# Patient Record
Sex: Male | Born: 2000 | Hispanic: Yes | Marital: Single | Smoking: Never smoker
Health system: Southern US, Community
[De-identification: ages and names within clinical notes are randomized; demographics above are authoritative.]

---
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
## 2018-09-26 ENCOUNTER — Emergency Department
Admission: EM | Admit: 2018-09-26 | Discharge: 2018-09-26 | Disposition: A | Discharge status: Home / Self Care | Payer: Self-pay | Attending: Emergency Medicine | Admitting: Emergency Medicine

## 2018-09-26 ENCOUNTER — Other Ambulatory Visit: Payer: Self-pay

## 2018-09-26 DIAGNOSIS — L03317 Cellulitis of buttock: Secondary | ICD-10-CM | POA: Insufficient documentation

## 2018-09-26 DIAGNOSIS — L0231 Cutaneous abscess of buttock: Secondary | ICD-10-CM | POA: Insufficient documentation

## 2018-09-26 MED ORDER — SULFAMETHOXAZOLE-TRIMETHOPRIM 800-160 MG PO TABS
1.0000 | ORAL_TABLET | Freq: Once | ORAL | Status: AC
  Administered 2018-09-26: 1 via ORAL
  Filled 2018-09-26: qty 1

## 2018-09-26 MED ORDER — NAPROXEN 500 MG PO TABS: 500.0000 mg | ORAL_TABLET | Freq: Two times a day (BID) | ORAL | Status: AC

## 2018-09-26 MED ORDER — SULFAMETHOXAZOLE-TRIMETHOPRIM 800-160 MG PO TABS: 1.0000 | ORAL_TABLET | Freq: Two times a day (BID) | ORAL | 0 refills | Status: AC

## 2018-09-26 MED ORDER — TRAMADOL HCL 50 MG PO TABS
50.0000 mg | ORAL_TABLET | Freq: Four times a day (QID) | ORAL | 0 refills | Status: AC | PRN
Start: 2018-09-26 — End: 2018-10-01

## 2018-09-26 MED ORDER — NAPROXEN 500 MG PO TABS
500.0000 mg | ORAL_TABLET | Freq: Once | ORAL | Status: AC
  Administered 2018-09-26: 500 mg via ORAL
  Filled 2018-09-26: qty 1

## 2018-09-26 NOTE — Discharge Instructions (Addendum)
Your lesion does not require incision and drainage at this time.  Follow discharge care instructions and take medication as directed.  Return to ED if condition worsens.

## 2018-09-26 NOTE — ED Triage Notes (Signed)
Pt states abscess to mid buttocks since Tuesday. Pt points to pilonidal area. Pt denies known fever, states hurts to sit.

## 2018-09-26 NOTE — ED Provider Notes (Signed)
[facility] Emergency Department Provider Note   ____________________________________________   First MD Initiated Contact with Patient 09/26/18 2125     (approximate)  I have reviewed the triage vital signs and the nursing notes.   HISTORY  Chief Complaint Abscess    HPI Kent Bowman is a 18 y.o. male patient presents with abscess between the buttocks for 3 days.  Patient denies drainage.  Patient states becoming uncomfortable to sit.  Patient rates his pain is 8/10.  Patient described the pain is "achy".  No palliative measure for complaint.         No past medical history on file.  There are no active problems to display for this patient.   No past surgical history on file.  Prior to Admission medications   Medication Sig Start Date End Date Taking? Authorizing Provider  ciprofloxacin (CIPRO) 500 MG tablet Take 1 tablet (500 mg total) by mouth 2 (two) times daily. 11/14/16   Paulette Blanch, MD  naproxen (NAPROSYN) 500 MG tablet Take 1 tablet (500 mg total) by mouth 2 (two) times daily with a meal. 09/26/18   Sable Feil, PA-C  ondansetron (ZOFRAN ODT) 4 MG disintegrating tablet Take 1 tablet (4 mg total) by mouth every 8 (eight) hours as needed for nausea or vomiting. 11/14/16   Paulette Blanch, MD  sulfamethoxazole-trimethoprim (BACTRIM DS) 800-160 MG tablet Take 1 tablet by mouth 2 (two) times daily. 09/26/18   Sable Feil, PA-C  traMADol (ULTRAM) 50 MG tablet Take 1 tablet (50 mg total) by mouth every 6 (six) hours as needed for up to 5 days. 09/26/18 10/01/18  Sable Feil, PA-C    Allergies Patient has no known allergies.  No family history on file.  Social History Social History   Tobacco Use  . Smoking status: Never Smoker  . Smokeless tobacco: Never Used  Substance Use Topics  . Alcohol use: No  . Drug use: Not on file    Review of Systems Constitutional: No fever/chills Eyes: No visual changes. ENT: No sore  throat. Cardiovascular: Denies chest pain. Respiratory: Denies shortness of breath. Gastrointestinal: No abdominal pain.  No nausea, no vomiting.  No diarrhea.  No constipation. Genitourinary: Negative for dysuria. Musculoskeletal: Negative for back pain. Skin: Negative for rash.  Nodule lesion between buttocks. Neurological: Negative for headaches, focal weakness or numbness.   ____________________________________________   PHYSICAL EXAM:  VITAL SIGNS: ED Triage Vitals  Enc Vitals Group     BP 09/26/18 2105 121/66     Pulse Rate 09/26/18 2105 (!) 110     Resp 09/26/18 2105 16     Temp 09/26/18 2105 (!) 100.8 F (38.2 C)     Temp Source 09/26/18 2105 Oral     SpO2 09/26/18 2105 100 %     Weight 09/26/18 2106 160 lb (72.6 kg)     Height 09/26/18 2106 5\' 4"  (1.626 m)     Head Circumference --      Peak Flow --      Pain Score 09/26/18 2105 8     Pain Loc --      Pain Edu? --      Excl. in [place]? --     Constitutional: Alert and oriented.  Moderate distress.   Low-grade fever. Cardiovascular: Tachycardic, regular rhythm. Grossly normal heart sounds.  Good peripheral circulation. Respiratory: Normal respiratory effort.  No retractions. Lungs CTAB. Gastrointestinal: Soft and nontender. No distention. No abdominal bruits. No  CVA tenderness.  Skin:  Skin is warm, dry and intact. No rash noted.  Nodule lesion between the buttocks. Psychiatric: Mood and affect are normal. Speech and behavior are normal.  ____________________________________________   LABS (all labs ordered are listed, but only abnormal results are displayed)  Labs Reviewed - No data to display ____________________________________________  EKG   ____________________________________________  RADIOLOGY  ED MD interpretation:    Official radiology report(s): No results found.  ____________________________________________   PROCEDURES  Procedure(s) performed (including Critical Care):  Procedures    ____________________________________________   INITIAL IMPRESSION / ASSESSMENT AND PLAN / ED COURSE  As part of my medical decision making, I reviewed the following data within the electronic MEDICAL RECORD NUMBER         Kent Bowman was evaluated in Emergency Department on 09/26/2018 for the symptoms described in the history of present illness. He was evaluated in the context of the global COVID-19 pandemic, which necessitated consideration that the patient might be at risk for infection with the SARS-CoV-2 virus that causes COVID-19. Institutional protocols and algorithms that pertain to the evaluation of patients at risk for COVID-19 are in a state of rapid change based on information released by regulatory bodies including the CDC and federal and state organizations. These policies and algorithms were followed during the patient's care in the ED.  Patient presents with pain secondary to her nausea lesion between her buttocks.  Examination lesions shows is nonfluctuant with no drainage.  Discussed with patient rationale for not incised and drained at this time.  Patient given discharge care instructions advised take medication as directed.  Patient advised return to ED if condition worsens.      ____________________________________________   FINAL CLINICAL IMPRESSION(S) / ED DIAGNOSES  Final diagnoses:  Cellulitis and abscess of buttock     ED Discharge Orders         Ordered    naproxen (NAPROSYN) 500 MG tablet  2 times daily with meals     09/26/18 2200    sulfamethoxazole-trimethoprim (BACTRIM DS) 800-160 MG tablet  2 times daily     09/26/18 2200    traMADol (ULTRAM) 50 MG tablet  Every 6 hours PRN     09/26/18 2200           Note:  This document was prepared using Dragon voice recognition software and may include unintentional dictation errors.    Joni ReiningSmith, Ronald K, PA-C 09/26/18 2207    Emily FilbertWilliams, Jonathan E, MD 09/26/18 [phone]

## 2019-02-24 ENCOUNTER — Other Ambulatory Visit: Payer: Self-pay

## 2019-02-24 DIAGNOSIS — Z20822 Contact with and (suspected) exposure to covid-19: Secondary | ICD-10-CM

## 2019-02-26 LAB — NOVEL CORONAVIRUS, NAA: SARS-CoV-2, NAA: NOT DETECTED
